# Patient Record
Sex: Male | Born: 1970 | Race: White | Hispanic: No | Marital: Married | Smoking: Never smoker
Health system: Southern US, Community
[De-identification: ages and names within clinical notes are randomized; demographics above are authoritative.]

## PROBLEM LIST (undated history)

## (undated) DIAGNOSIS — I10 Essential (primary) hypertension: Secondary | ICD-10-CM

## (undated) DIAGNOSIS — F419 Anxiety disorder, unspecified: Secondary | ICD-10-CM

## (undated) HISTORY — PX: NOSE SURGERY: SHX723

---

## 2016-09-17 ENCOUNTER — Emergency Department (HOSPITAL_COMMUNITY): Payer: BLUE CROSS/BLUE SHIELD

## 2016-09-17 ENCOUNTER — Encounter (HOSPITAL_COMMUNITY): Payer: Self-pay | Admitting: *Deleted

## 2016-09-17 DIAGNOSIS — I10 Essential (primary) hypertension: Secondary | ICD-10-CM | POA: Diagnosis not present

## 2016-09-17 DIAGNOSIS — R002 Palpitations: Secondary | ICD-10-CM | POA: Diagnosis present

## 2016-09-17 LAB — CBC
HCT: 44.5 % (ref 39.0–52.0)
Hemoglobin: 15.9 g/dL (ref 13.0–17.0)
MCH: 31.8 pg (ref 26.0–34.0)
MCHC: 35.7 g/dL (ref 30.0–36.0)
MCV: 89 fL (ref 78.0–100.0)
PLATELETS: 189 10*3/uL (ref 150–400)
RBC: 5 MIL/uL (ref 4.22–5.81)
RDW: 12.6 % (ref 11.5–15.5)
WBC: 8 10*3/uL (ref 4.0–10.5)

## 2016-09-17 LAB — BASIC METABOLIC PANEL
Anion gap: 8 (ref 5–15)
BUN: 11 mg/dL (ref 6–20)
CALCIUM: 9.5 mg/dL (ref 8.9–10.3)
CO2: 24 mmol/L (ref 22–32)
CREATININE: 1.35 mg/dL — AB (ref 0.61–1.24)
Chloride: 105 mmol/L (ref 101–111)
GFR calc Af Amer: >60 mL/min (ref >60)
GLUCOSE: 119 mg/dL — AB (ref 65–99)
POTASSIUM: 3.4 mmol/L — AB (ref 3.5–5.1)
SODIUM: 137 mmol/L (ref 135–145)

## 2016-09-17 LAB — I-STAT TROPONIN, ED: TROPONIN I, POC: 0.01 ng/mL (ref 0.00–0.08)

## 2016-09-17 NOTE — ED Triage Notes (Signed)
Pt was eating a snack, felt a twinge of pain through his chest. EMS was called, EKG showed a-fib at a rate of 114, bp 168/105. Pt recently started on lisinopril. Pt also reports tingling and numbness in extremities, which subsided when EMS reported his ekg was ok. Pt denies pain or shortness of breath at this time

## 2016-09-18 ENCOUNTER — Emergency Department (HOSPITAL_COMMUNITY)
Admission: EM | Admit: 2016-09-18 | Discharge: 2016-09-18 | Disposition: A | Discharge status: Home / Self Care | Payer: BLUE CROSS/BLUE SHIELD | Attending: Emergency Medicine | Admitting: Emergency Medicine

## 2016-09-18 DIAGNOSIS — R002 Palpitations: Secondary | ICD-10-CM

## 2016-09-18 HISTORY — DX: Essential (primary) hypertension: I10

## 2016-09-18 LAB — I-STAT TROPONIN, ED: Troponin i, poc: 0.01 ng/mL (ref 0.00–0.08)

## 2016-09-18 LAB — MAGNESIUM: MAGNESIUM: 1.8 mg/dL (ref 1.7–2.4)

## 2016-09-18 MED ORDER — MAGNESIUM SULFATE 2 GM/50ML IV SOLN
2.0000 g | Freq: Once | INTRAVENOUS | Status: AC
  Administered 2016-09-18: 2 g via INTRAVENOUS
  Filled 2016-09-18: qty 50

## 2016-09-18 MED ORDER — POTASSIUM CHLORIDE CRYS ER 20 MEQ PO TBCR
60.0000 meq | EXTENDED_RELEASE_TABLET | Freq: Once | ORAL | Status: AC
  Administered 2016-09-18: 60 meq via ORAL
  Filled 2016-09-18: qty 3

## 2016-09-18 NOTE — ED Provider Notes (Signed)
[facility]EMERGENCY DEPT Provider Note   CSN: [id number] Arrival date & time: 09/17/16  2215   By signing my name below, I, Ronald Smith, attest that this documentation has been prepared under the direction and in the presence of Ronald CrumbleAdeleke Lillyrose Reitan, MD . Electronically Signed: Freida Busmaniana Smith, Scribe. 09/18/2016. 2:04 AM.  History   Chief Complaint Chief Complaint  Patient presents with  . Palpitations    The history is provided by the patient. No language interpreter was used.   HPI Comments:  Ronald Smith is a 45 y.o. male with a history of HTN, who presents to the Emergency Department complaining of an episode of palpitations which began after eating a snack a few hours PTA. He states his heart was racing.  Pt reports associated lightheadedness at the time of symptom onset.  Shortly after the episode of palpitations, he began to have numbness in his extremities and was told by EMS it may due to anxiety. EMS obtained an EKG that showed AFIB at 114 bpm.  He denies nausea, SOB, cough, rhinorrhea, and diaphoresis. Pt notes he was started on HTN meds ~ 3 weeks ago due to blood pressures in the 190s.   Past Medical History:  Diagnosis Date  . Hypertension     There are no active problems to display for this patient.   Past Surgical History:  Procedure Laterality Date  . NOSE SURGERY         Home Medications    Prior to Admission medications   Medication Sig Start Date End Date Taking? Authorizing Provider  lisinopril-hydrochlorothiazide (PRINZIDE,ZESTORETIC) 20-12.5 MG tablet Take 1 tablet by mouth daily.   Yes Historical Provider, MD    Family History No family history on file.  Social History Social History  Substance Use Topics  . Smoking status: Never Smoker  . Smokeless tobacco: Never Used  . Alcohol use No     Allergies   Patient has no known allergies.   Review of Systems Review of Systems 10 systems reviewed and all are negative for acute change except as noted  in the HPI.  Physical Exam Updated Vital Signs BP (!) 176/105   Pulse 100   Temp 98.3 F (36.8 C) (Oral)   Resp 18   SpO2 100%   Physical Exam  Constitutional: He is oriented to person, place, and time. Vital signs are normal. He appears well-developed and well-nourished.  Non-toxic appearance. He does not appear ill. No distress.  HENT:  Head: Normocephalic and atraumatic.  Nose: Nose normal.  Mouth/Throat: Oropharynx is clear and moist. No oropharyngeal exudate.  Eyes: Conjunctivae and EOM are normal. Pupils are equal, round, and reactive to light. No scleral icterus.  Neck: Normal range of motion. Neck supple. No tracheal deviation, no edema, no erythema and normal range of motion present. No thyroid mass and no thyromegaly present.  Cardiovascular: Regular rhythm, S1 normal, S2 normal, normal heart sounds, intact distal pulses and normal pulses.  Tachycardia present.  Exam reveals no gallop and no friction rub.   No murmur heard. Pulmonary/Chest: Effort normal and breath sounds normal. No respiratory distress. He has no wheezes. He has no rhonchi. He has no rales.  Abdominal: Soft. Normal appearance and bowel sounds are normal. He exhibits no distension, no ascites and no mass. There is no hepatosplenomegaly. There is no tenderness. There is no rebound, no guarding and no CVA tenderness.  Musculoskeletal: Normal range of motion. He exhibits no edema or tenderness.  Lymphadenopathy:    He has no  cervical adenopathy.  Neurological: He is alert and oriented to person, place, and time. He has normal strength. No cranial nerve deficit or sensory deficit.  Skin: Skin is warm, dry and intact. No petechiae and no rash noted. He is not diaphoretic. No erythema. No pallor.  Nursing note and vitals reviewed.  ED Treatments / Results  DIAGNOSTIC STUDIES:  Oxygen Saturation is 100% on RA, normal by my interpretation.    COORDINATION OF CARE:  2:03 AM Discussed treatment plan with pt at  bedside and pt agreed to plan.  Labs (all labs ordered are listed, but only abnormal results are displayed) Labs Reviewed  BASIC METABOLIC PANEL - Abnormal; Notable for the following:       Result Value   Potassium 3.4 (*)    Glucose, Bld 119 (*)    Creatinine, Ser 1.35 (*)    All other components within normal limits  CBC  I-STAT TROPOININ, ED    EKG  EKG Interpretation None       Radiology Dg Chest 2 View  Result Date: 09/17/2016 CLINICAL DATA:  Midsternal chest pain and dyspnea, onset tonight after eating late night snack. EXAM: CHEST  2 VIEW COMPARISON:  None. FINDINGS: The heart size and mediastinal contours are within normal limits. Both lungs are clear. The visualized skeletal structures are unremarkable. IMPRESSION: No active cardiopulmonary disease. Electronically Signed   By: Ellery Plunkaniel R Mitchell M.D.   On: 09/17/2016 23:07    Procedures Procedures (including critical care time)  Medications Ordered in ED Medications - No data to display   Initial Impression / Assessment and Plan / ED Course  I have reviewed the triage vital signs and the nursing notes.  Pertinent labs & imaging results that were available during my care of the patient were reviewed by me and considered in my medical decision making (see chart for details).  Clinical Course     Patient presents to the for palpitations.  He denies associated vomiting or diaphoresis.  He history is low risk for ACS.  HEART score is less than 3.  Will keep in the ED for repeat troponin in 3 hours as well as EKG.  Potassium and mag was replaced to 4.0 and 2.0 respectively.    3:13 AM Repeat trop and ekg are negative.  He continues to be asymptomatic.  PCP fu advised within 3 days.  He appears well and in NAD.  Vs remain within his normal limits and he is safe for Dc.    Final Clinical Impressions(s) / ED Diagnoses   Final diagnoses:  None    New Prescriptions New Prescriptions   No medications on file      I personally performed the services described in this documentation, which was scribed in my presence. The recorded information has been reviewed and is accurate.       Ronald CrumbleAdeleke Kazzandra Desaulniers, MD 09/18/16 [phone]

## 2016-09-18 NOTE — ED Notes (Signed)
Pt departed in NAD.  

## 2016-10-10 ENCOUNTER — Emergency Department (HOSPITAL_COMMUNITY): Payer: BLUE CROSS/BLUE SHIELD

## 2016-10-10 ENCOUNTER — Encounter (HOSPITAL_COMMUNITY): Payer: Self-pay

## 2016-10-10 DIAGNOSIS — I10 Essential (primary) hypertension: Secondary | ICD-10-CM | POA: Diagnosis not present

## 2016-10-10 DIAGNOSIS — R079 Chest pain, unspecified: Secondary | ICD-10-CM | POA: Insufficient documentation

## 2016-10-10 DIAGNOSIS — F410 Panic disorder [episodic paroxysmal anxiety] without agoraphobia: Secondary | ICD-10-CM | POA: Insufficient documentation

## 2016-10-10 DIAGNOSIS — F419 Anxiety disorder, unspecified: Secondary | ICD-10-CM | POA: Diagnosis not present

## 2016-10-10 LAB — I-STAT TROPONIN, ED: TROPONIN I, POC: 0 ng/mL (ref 0.00–0.08)

## 2016-10-10 NOTE — ED Triage Notes (Signed)
Pt states that he was seen about two weeks ago for anxiety attack, followed up with PCP, pt states anxiety is out of control today. CP 2/10

## 2016-10-11 ENCOUNTER — Emergency Department (HOSPITAL_COMMUNITY)
Admission: EM | Admit: 2016-10-11 | Discharge: 2016-10-11 | Disposition: A | Discharge status: Home / Self Care | Payer: BLUE CROSS/BLUE SHIELD | Attending: Emergency Medicine | Admitting: Emergency Medicine

## 2016-10-11 DIAGNOSIS — F410 Panic disorder [episodic paroxysmal anxiety] without agoraphobia: Secondary | ICD-10-CM

## 2016-10-11 DIAGNOSIS — F419 Anxiety disorder, unspecified: Secondary | ICD-10-CM

## 2016-10-11 HISTORY — DX: Anxiety disorder, unspecified: F41.9

## 2016-10-11 LAB — BASIC METABOLIC PANEL
Anion gap: 14 (ref 5–15)
BUN: 10 mg/dL (ref 6–20)
CALCIUM: 9.9 mg/dL (ref 8.9–10.3)
CHLORIDE: 104 mmol/L (ref 101–111)
CO2: 19 mmol/L — ABNORMAL LOW (ref 22–32)
CREATININE: 0.93 mg/dL (ref 0.61–1.24)
Glucose, Bld: 113 mg/dL — ABNORMAL HIGH (ref 65–99)
Potassium: 3.5 mmol/L (ref 3.5–5.1)
SODIUM: 137 mmol/L (ref 135–145)

## 2016-10-11 LAB — CBC
HCT: 45.7 % (ref 39.0–52.0)
Hemoglobin: 16.4 g/dL (ref 13.0–17.0)
MCH: 32.5 pg (ref 26.0–34.0)
MCHC: 35.9 g/dL (ref 30.0–36.0)
MCV: 90.7 fL (ref 78.0–100.0)
PLATELETS: 190 10*3/uL (ref 150–400)
RBC: 5.04 MIL/uL (ref 4.22–5.81)
RDW: 12.9 % (ref 11.5–15.5)
WBC: 8.1 10*3/uL (ref 4.0–10.5)

## 2016-10-11 MED ORDER — LORAZEPAM 1 MG PO TABS
1.0000 mg | ORAL_TABLET | Freq: Once | ORAL | Status: AC
  Administered 2016-10-11: 1 mg via ORAL
  Filled 2016-10-11: qty 1

## 2016-10-11 MED ORDER — LORAZEPAM 1 MG PO TABS: 0.5000 mg | ORAL_TABLET | Freq: Two times a day (BID) | ORAL | 0 refills | Status: AC | PRN

## 2016-10-11 MED ORDER — LORAZEPAM 1 MG PO TABS: 1.0000 mg | ORAL_TABLET | Freq: Two times a day (BID) | ORAL | 0 refills | Status: DC | PRN

## 2016-10-11 NOTE — ED Provider Notes (Signed)
[facility] Provider Note   CSN: [id number] Arrival date & time: 10/10/16  2256  By signing my name below, I, Ronald Smith, attest that this documentation has been prepared under the direction and in the presence of Ronald Creasehristopher J Edrik Rundle, MD. Electronically Signed: Modena JanskyAlbert Smith, Scribe. 10/11/2016. 3:43 AM.  History   Chief Complaint Chief Complaint  Patient presents with  . Panic Attack  . Chest Pain   The history is provided by the patient. No language interpreter was used.   HPI Comments: Ronald Smith is a 45 y.o. male with a PMHx of anxiety and HTN who presents to the Emergency Department complaining of constant moderate chest pain that started today. He states that he started taking celexa medication today, and he started having anxiety and increased heart rate with pain. He was prescribed celexa for a prior episode of chest pain due to a panic attack. He reports no modifying. He denies any other complaints.   Past Medical History:  Diagnosis Date  . Anxiety   . Hypertension     There are no active problems to display for this patient.   Past Surgical History:  Procedure Laterality Date  . NOSE SURGERY         Home Medications    Prior to Admission medications   Medication Sig Start Date End Date Taking? Authorizing Provider  lisinopril-hydrochlorothiazide (PRINZIDE,ZESTORETIC) 20-12.5 MG tablet Take 1 tablet by mouth daily.    Historical Provider, MD    Family History No family history on file.  Social History Social History  Substance Use Topics  . Smoking status: Never Smoker  . Smokeless tobacco: Never Used  . Alcohol use No     Allergies   Patient has no known allergies.   Review of Systems Review of Systems  Cardiovascular: Positive for chest pain.  Psychiatric/Behavioral: The patient is nervous/anxious.   All other systems reviewed and are negative.    Physical Exam Updated Vital Signs BP 150/87 (BP Location: Right Arm)    Pulse 75   Temp 98.3 F (36.8 C)   Resp 18   Ht 6\' 2"  (1.88 m)   Wt 262 lb (118.8 kg)   SpO2 99%   BMI 33.64 kg/m   Physical Exam  Constitutional: He is oriented to person, place, and time. He appears well-developed and well-nourished. No distress.  HENT:  Head: Normocephalic and atraumatic.  Right Ear: Hearing normal.  Left Ear: Hearing normal.  Nose: Nose normal.  Mouth/Throat: Oropharynx is clear and moist and mucous membranes are normal.  Eyes: Conjunctivae and EOM are normal. Pupils are equal, round, and reactive to light.  Neck: Normal range of motion. Neck supple.  Cardiovascular: Regular rhythm, S1 normal and S2 normal.  Exam reveals no gallop and no friction rub.   No murmur heard. Pulmonary/Chest: Effort normal and breath sounds normal. No respiratory distress. He exhibits no tenderness.  Abdominal: Soft. Normal appearance and bowel sounds are normal. There is no hepatosplenomegaly. There is no tenderness. There is no rebound, no guarding, no tenderness at McBurney's point and negative Murphy's sign. No hernia.  Musculoskeletal: Normal range of motion.  Neurological: He is alert and oriented to person, place, and time. He has normal strength. No cranial nerve deficit or sensory deficit. Coordination normal. GCS eye subscore is 4. GCS verbal subscore is 5. GCS motor subscore is 6.  Skin: Skin is warm, dry and intact. No rash noted. No cyanosis.  Psychiatric: His speech is normal and behavior is normal. Thought  content normal.  Anxious.   Nursing note and vitals reviewed.    ED Treatments / Results  DIAGNOSTIC STUDIES: Oxygen Saturation is 99% on RA, normal by my interpretation.    COORDINATION OF CARE: 3:47 AM- Pt advised of plan for treatment and pt agrees.  Labs (all labs ordered are listed, but only abnormal results are displayed) Labs Reviewed  BASIC METABOLIC PANEL - Abnormal; Notable for the following:       Result Value   CO2 19 (*)    Glucose, Bld 113  (*)    All other components within normal limits  CBC  I-STAT TROPOININ, ED    EKG  EKG Interpretation  Date/Time:  Wednesday October 10 2016 23:04:38 EST Ventricular Rate:  92 PR Interval:  166 QRS Duration: 92 QT Interval:  344 QTC Calculation: 425 R Axis:   17 Text Interpretation:  Normal sinus rhythm Normal ECG Confirmed by Ronald Capote  MD, Hollan Philipp [phone]([id number]) on 10/11/2016 3:41:26 AM       Radiology Dg Chest 2 View  Result Date: 10/10/2016 CLINICAL DATA:  Chest pain radiating down left arm. Lightheadedness. EXAM: CHEST  2 VIEW COMPARISON:  09/17/2016 FINDINGS: The cardiomediastinal contours are normal. The lungs are clear. Pulmonary vasculature is normal. No consolidation, pleural effusion, or pneumothorax. No acute osseous abnormalities are seen. IMPRESSION: No acute pulmonary process. Electronically Signed   By: Rubye OaksMelanie  Smith M.D.   On: 10/10/2016 23:33    Procedures Procedures (including critical care time)  Medications Ordered in ED Medications  LORazepam (ATIVAN) tablet 1 mg (1 mg Oral Given 10/11/16 0400)     Initial Impression / Assessment and Plan / ED Course  I have reviewed the triage vital signs and the nursing notes.  Pertinent labs & imaging results that were available during my care of the patient were reviewed by me and considered in my medical decision making (see chart for details).  Clinical Course    Patient presents to the emergency department for evaluation of anxiety. Patient reports that he has been experiencing increased anxiety and panic attacks recently. He was seen in the ER for chest pain and had a cardiac rule out. Symptoms were probably not panic attack. Patient followed up with his primary doctor who agreed with the diagnosis, prescribed and Celexa. He just started the Celexa today. Tonight he had an episode of a sharp shooting and stabbing pain in his left chest which resolved spontaneously, however left him feeling very anxious.  Patient does appear anxious on examination, has a normal cardiac workup once again. Symptoms are very atypical for cardiac etiology. His only cardiac risk factor is hypertension which was recently diagnosed. Patient administered Ativan, will continue the Celexa and was given limited supply of Xanax to be used as needed for anxiety. Follow-up with primary doctor.  Final Clinical Impressions(s) / ED Diagnoses   Final diagnoses:  Anxiety  Panic attack    New Prescriptions New Prescriptions   No medications on file   I personally performed the services described in this documentation, which was scribed in my presence. The recorded information has been reviewed and is accurate.     Ronald Creasehristopher J Letoya Stallone, MD 10/11/16 [phone]

## 2016-10-11 NOTE — ED Notes (Signed)
Pt states he is here for panic attacks with some mild chest pain. Pt states this started at 1500 on 10/10/16. Pt reports this started while at the grocery store.

## 2017-11-06 ENCOUNTER — Other Ambulatory Visit: Payer: Self-pay | Admitting: Internal Medicine

## 2017-11-06 DIAGNOSIS — R109 Unspecified abdominal pain: Secondary | ICD-10-CM

## 2017-11-12 ENCOUNTER — Other Ambulatory Visit: Payer: BLUE CROSS/BLUE SHIELD

## 2017-12-26 IMAGING — DX DG CHEST 2V
3 series · 3 of 3 positions shown · non-contrast
Comparison: None.

CLINICAL DATA: Midsternal chest pain and dyspnea, onset tonight
after eating late night snack.

EXAM:
CHEST  2 VIEW

[chest pa]
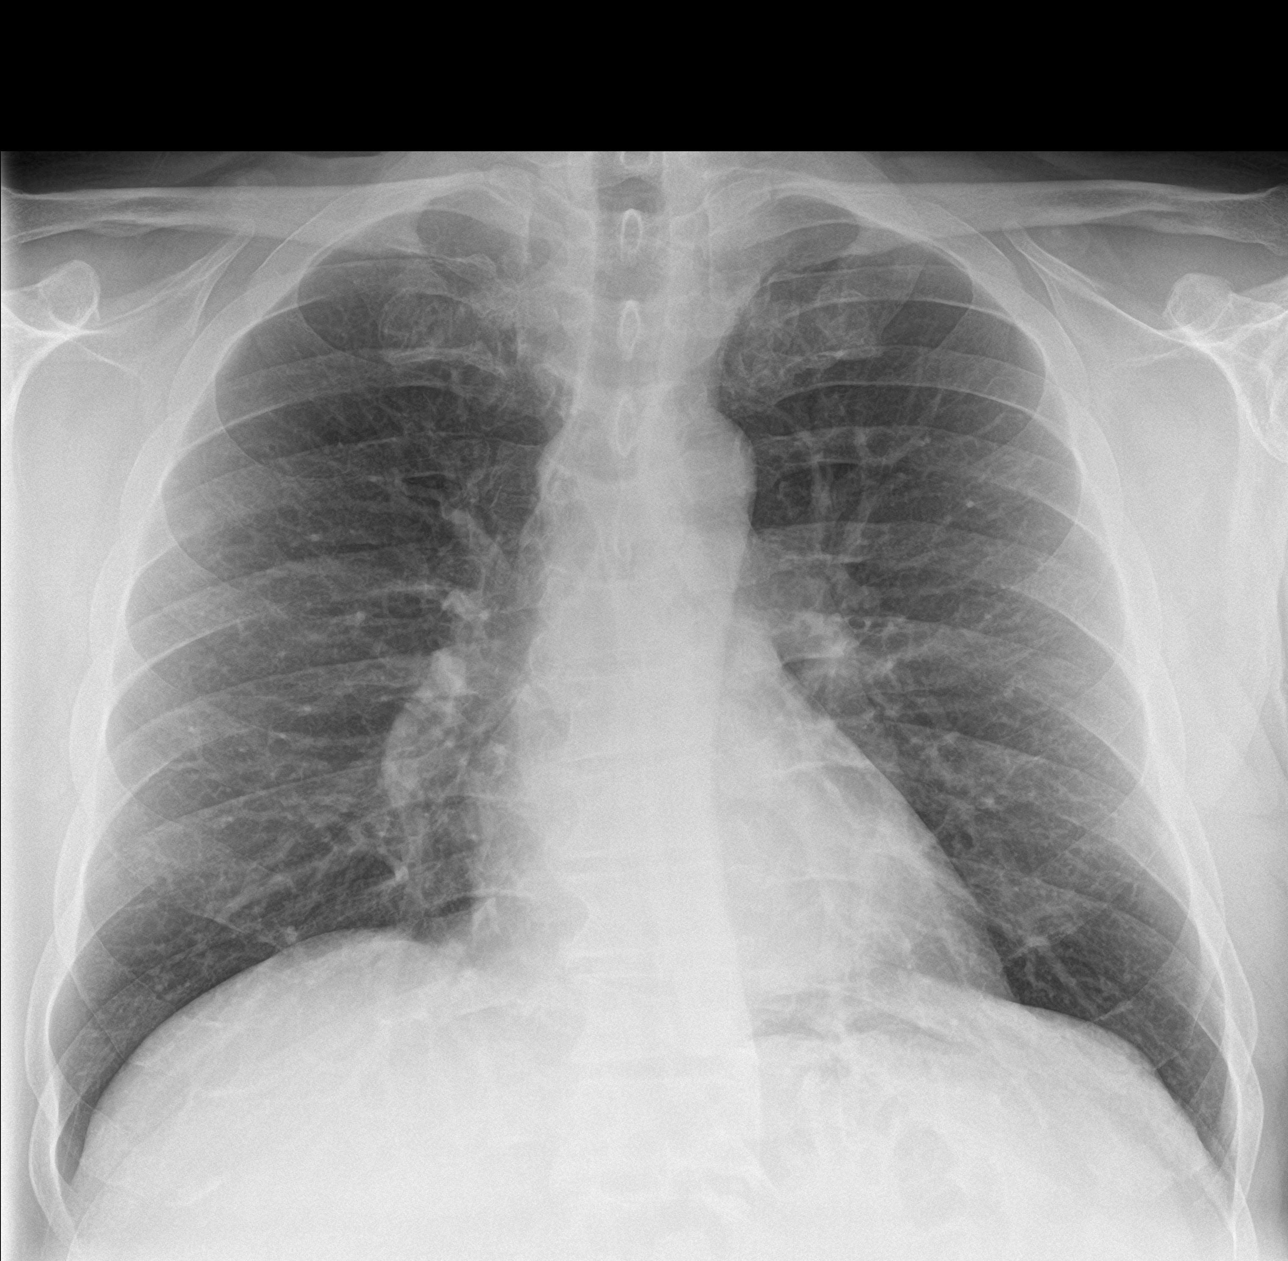

[chest lat (1 of 2)]
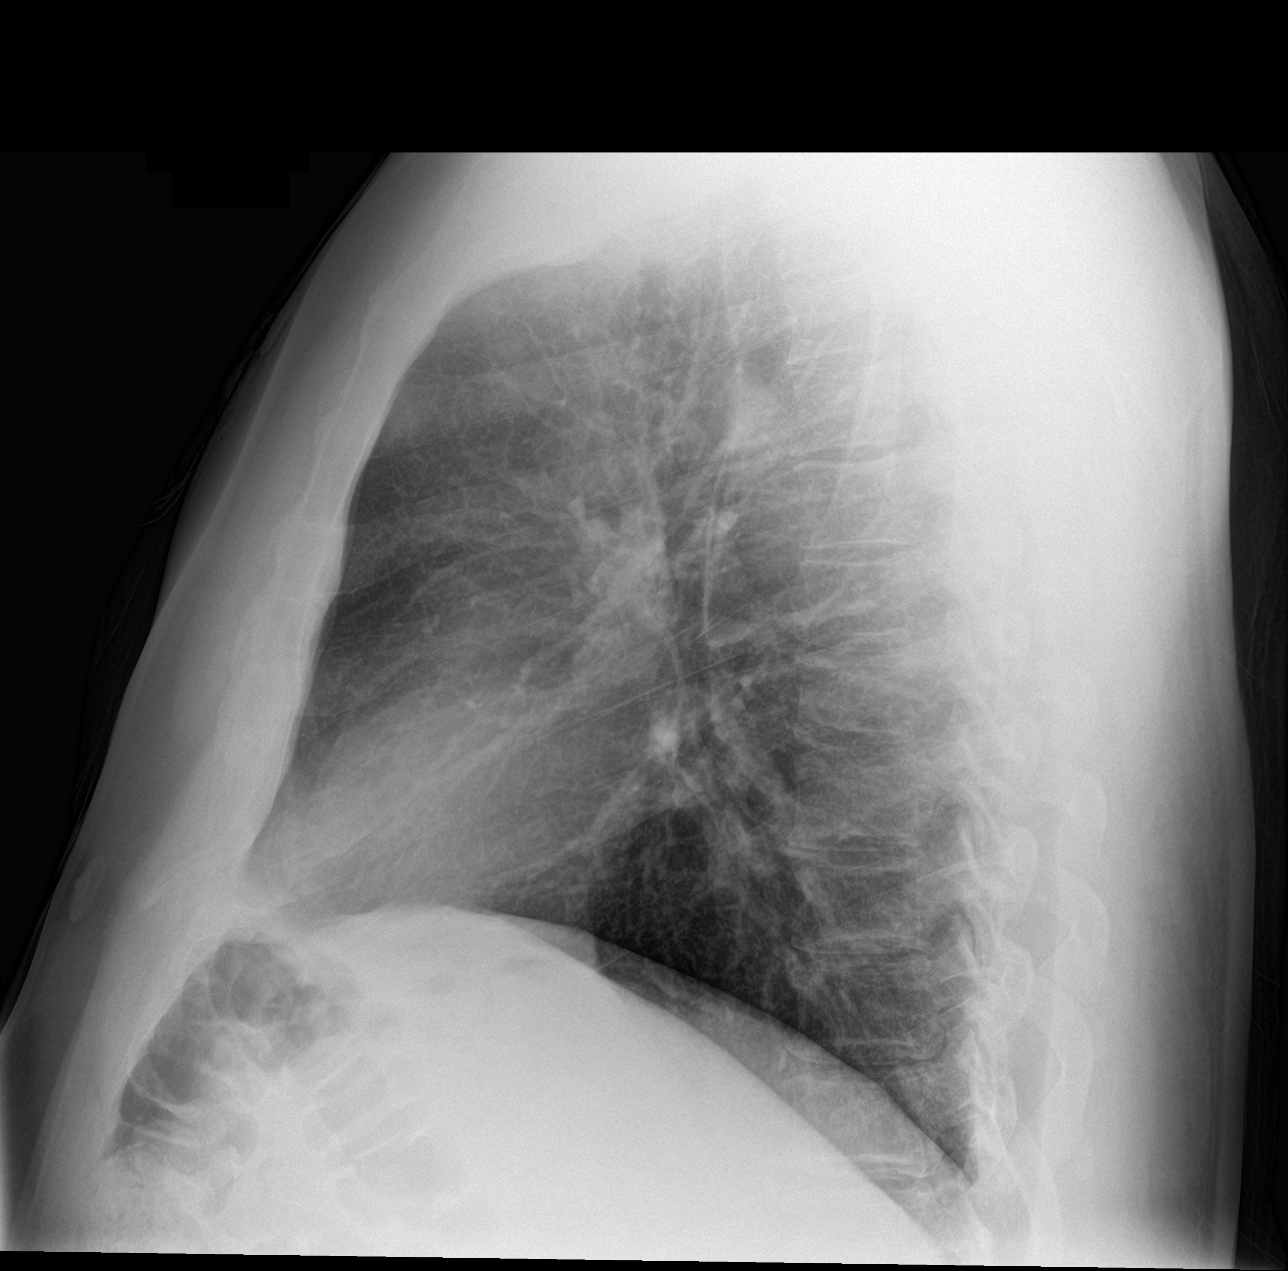

[chest lat (2 of 2)]
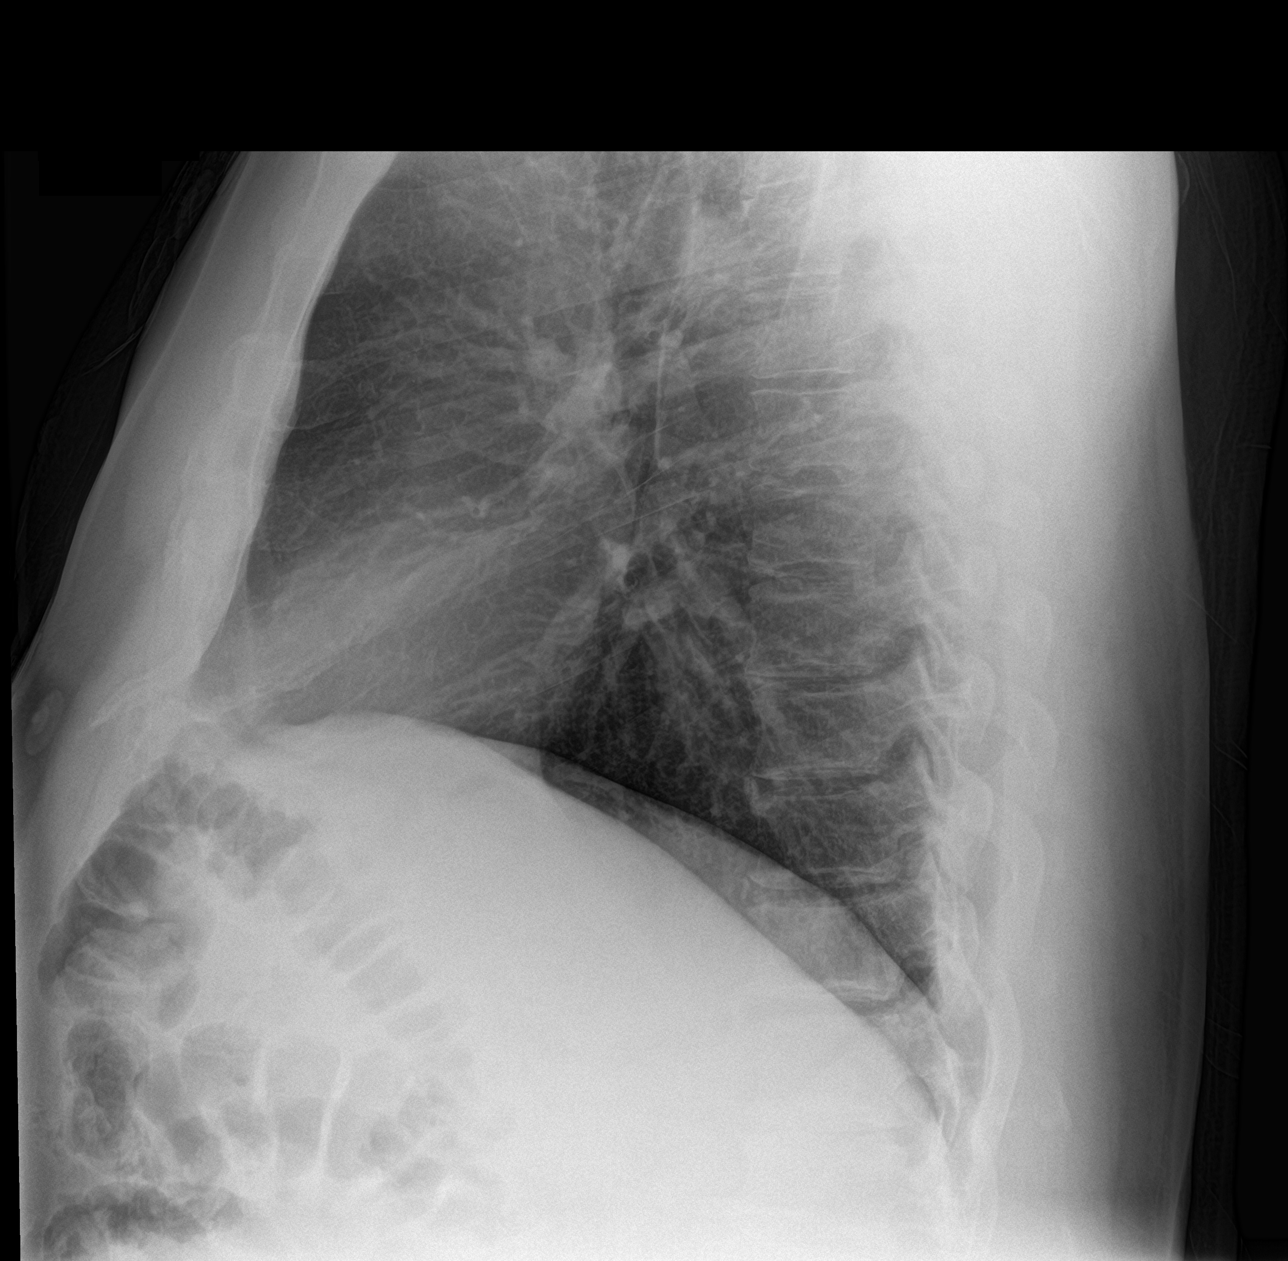

[3 of 3 positions shown; findings below may reference images not displayed]

FINDINGS: The heart size and mediastinal contours are within normal limits.
Both lungs are clear. The visualized skeletal structures are
unremarkable.
IMPRESSION: No active cardiopulmonary disease.

## 2018-01-18 IMAGING — DX DG CHEST 2V
2 series · 2 of 2 positions shown · non-contrast
Comparison: 09/17/2016

CLINICAL DATA: Chest pain radiating down left arm. Lightheadedness.

EXAM:
CHEST  2 VIEW

[chest pa]
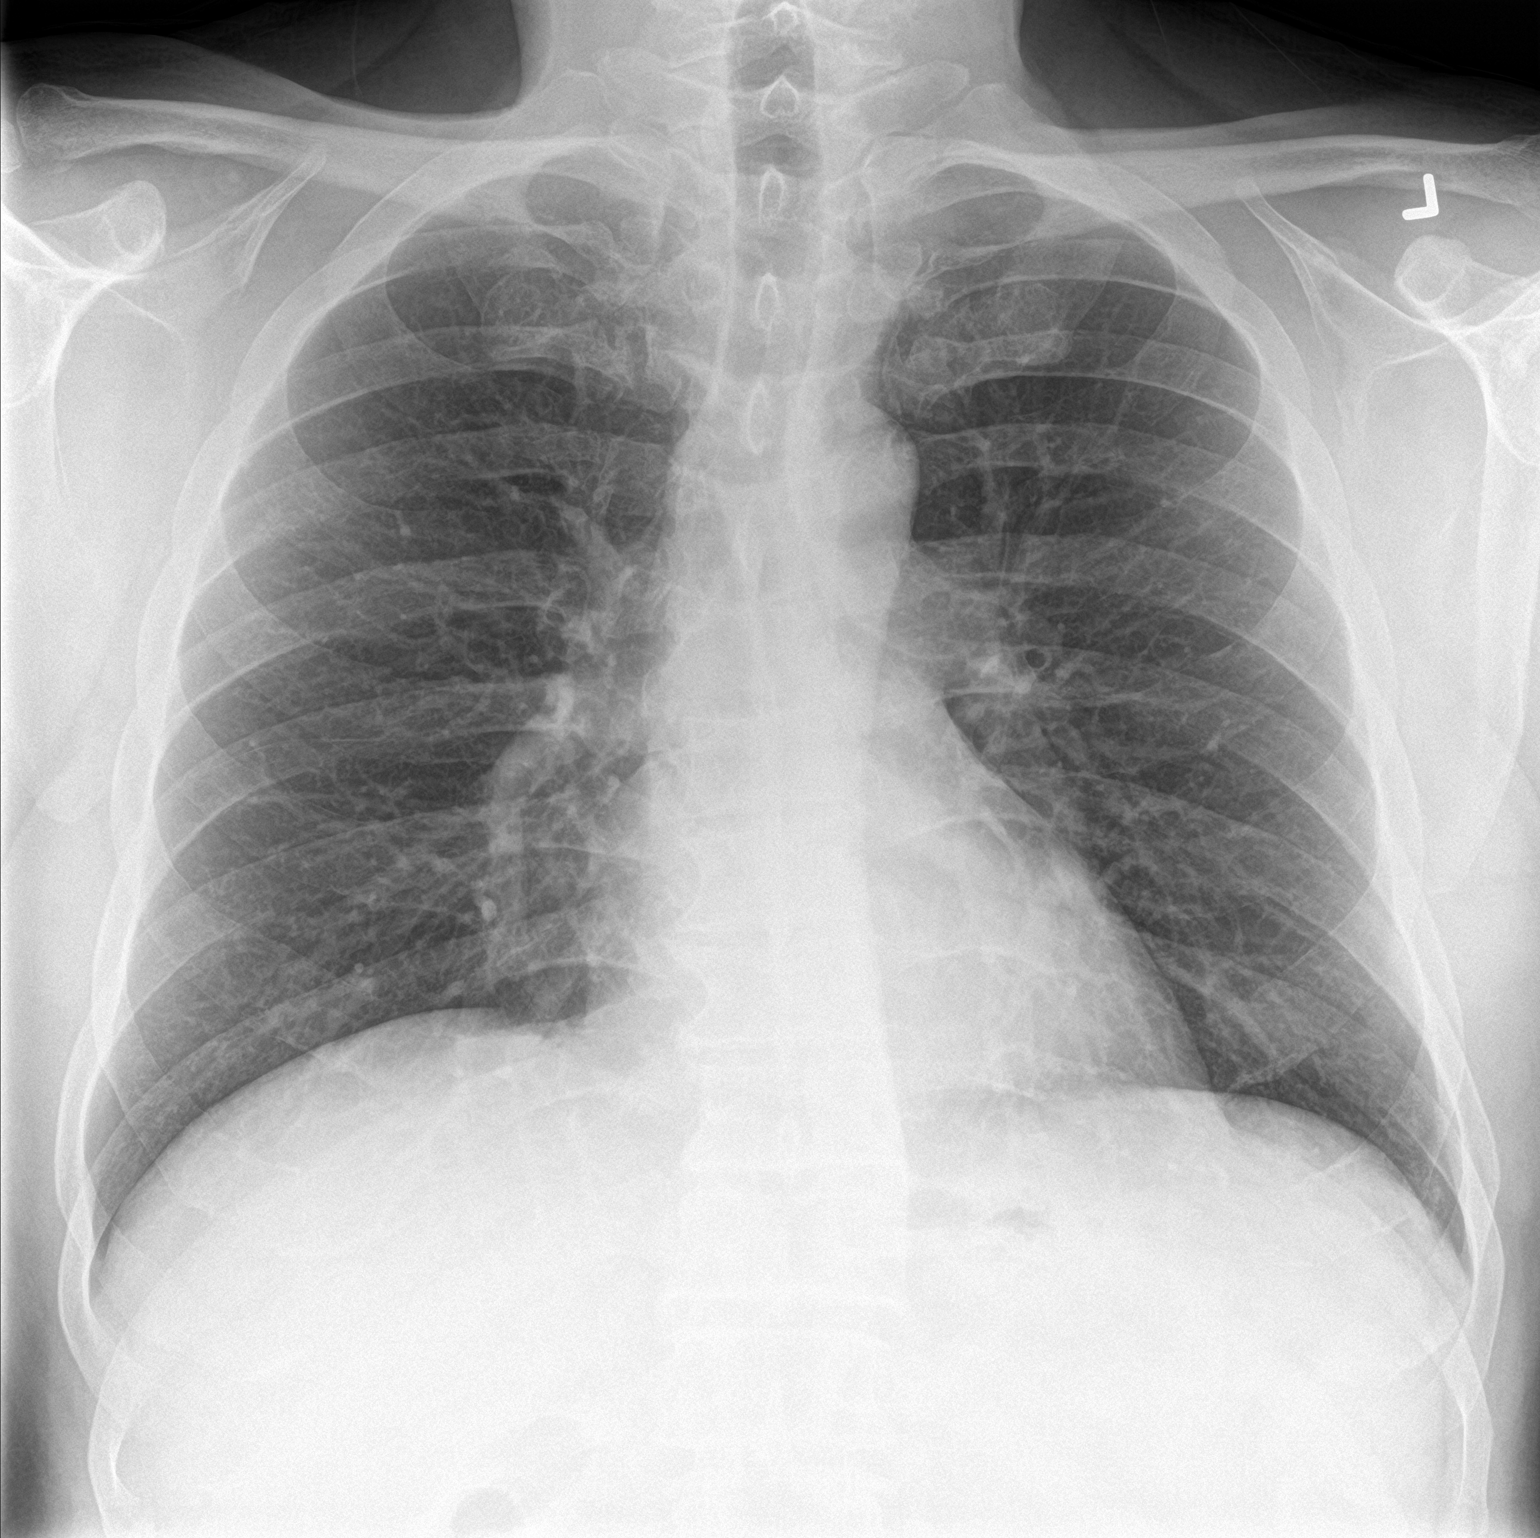

[chest lat]
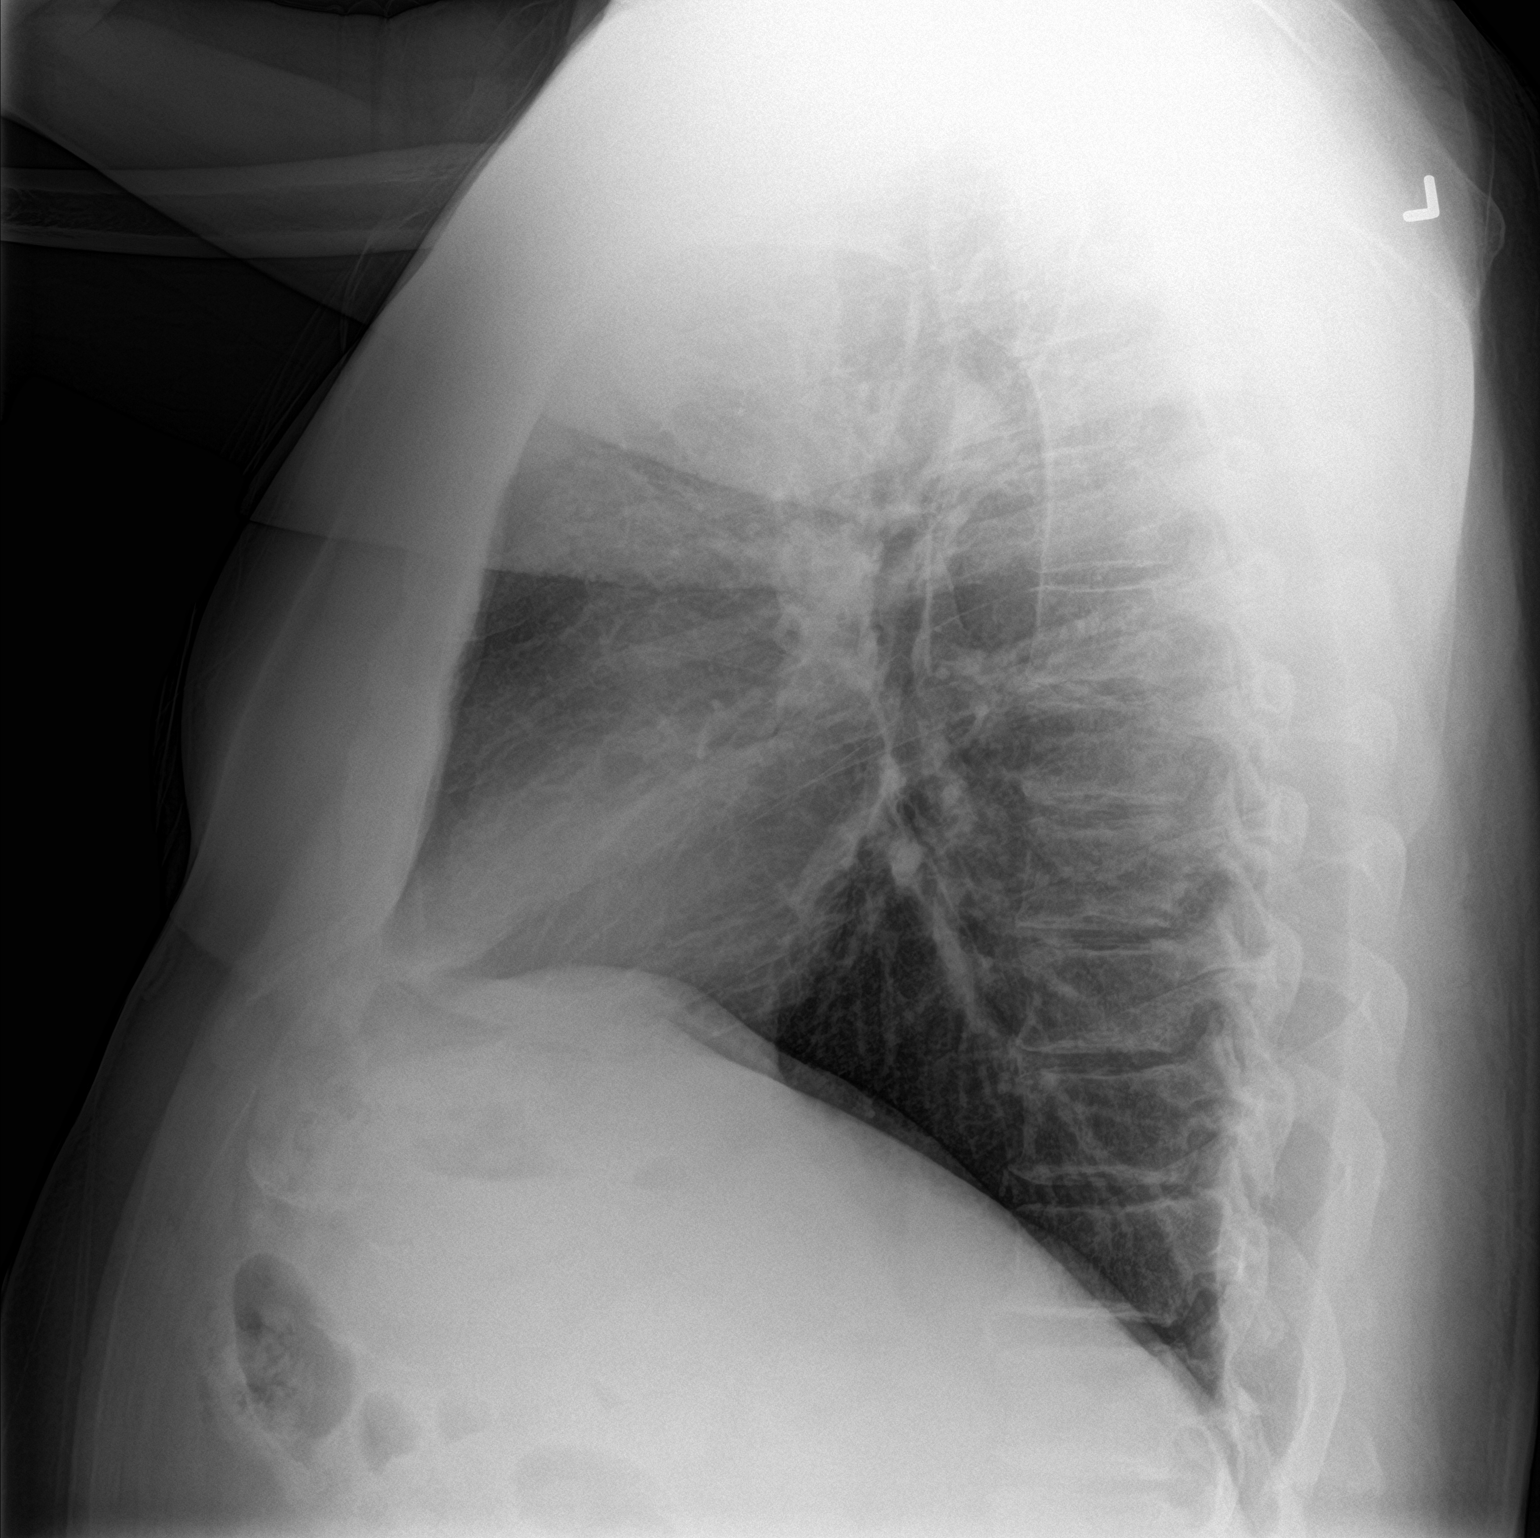

[2 of 2 positions shown; findings below may reference images not displayed]

FINDINGS: The cardiomediastinal contours are normal. The lungs are clear.
Pulmonary vasculature is normal. No consolidation, pleural effusion,
or pneumothorax. No acute osseous abnormalities are seen.
IMPRESSION: No acute pulmonary process.
